# Patient Record
Sex: Male | Born: 1995 | Race: White | Hispanic: No | Marital: Single | Smoking: Never smoker
Health system: Southern US, Community
[De-identification: ages and names within clinical notes are randomized; demographics above are authoritative.]

## PROBLEM LIST (undated history)

## (undated) HISTORY — PX: ANKLE SURGERY: SHX546

---

## 2016-12-13 ENCOUNTER — Emergency Department
Admission: EM | Admit: 2016-12-13 | Discharge: 2016-12-13 | Disposition: A | Discharge status: Home / Self Care | Payer: No Typology Code available for payment source | Attending: Emergency Medicine | Admitting: Emergency Medicine

## 2016-12-13 ENCOUNTER — Emergency Department: Payer: No Typology Code available for payment source

## 2016-12-13 DIAGNOSIS — R002 Palpitations: Secondary | ICD-10-CM

## 2016-12-13 DIAGNOSIS — R0602 Shortness of breath: Secondary | ICD-10-CM | POA: Diagnosis present

## 2016-12-13 LAB — CBC
HEMATOCRIT: 49.6 % (ref 40.0–52.0)
HEMOGLOBIN: 17.3 g/dL (ref 13.0–18.0)
MCH: 29.9 pg (ref 26.0–34.0)
MCHC: 35 g/dL (ref 32.0–36.0)
MCV: 85.5 fL (ref 80.0–100.0)
Platelets: 241 10*3/uL (ref 150–440)
RBC: 5.8 MIL/uL (ref 4.40–5.90)
RDW: 12.9 % (ref 11.5–14.5)
WBC: 6.9 10*3/uL (ref 3.8–10.6)

## 2016-12-13 LAB — BASIC METABOLIC PANEL
Anion gap: 7 (ref 5–15)
BUN: 16 mg/dL (ref 6–20)
CHLORIDE: 105 mmol/L (ref 101–111)
CO2: 28 mmol/L (ref 22–32)
CREATININE: 1.02 mg/dL (ref 0.61–1.24)
Calcium: 9.8 mg/dL (ref 8.9–10.3)
GFR calc non Af Amer: >60 mL/min (ref >60)
Glucose, Bld: 93 mg/dL (ref 65–99)
POTASSIUM: 3.4 mmol/L — AB (ref 3.5–5.1)
Sodium: 140 mmol/L (ref 135–145)

## 2016-12-13 LAB — TROPONIN I: Troponin I: <0.03 ng/mL (ref <0.03)

## 2016-12-13 MED ORDER — ALBUTEROL SULFATE (2.5 MG/3ML) 0.083% IN NEBU
2.5000 mg | INHALATION_SOLUTION | Freq: Once | RESPIRATORY_TRACT | Status: AC
  Administered 2016-12-13: 2.5 mg via RESPIRATORY_TRACT

## 2016-12-13 MED ORDER — ALBUTEROL SULFATE (2.5 MG/3ML) 0.083% IN NEBU
INHALATION_SOLUTION | RESPIRATORY_TRACT | Status: AC
  Filled 2016-12-13: qty 3

## 2016-12-13 MED ORDER — IPRATROPIUM-ALBUTEROL 0.5-2.5 (3) MG/3ML IN SOLN: 3.0000 mL | Freq: Once | RESPIRATORY_TRACT | Status: DC

## 2016-12-13 NOTE — ED Provider Notes (Signed)
[facility] Emergency Department Provider Note   ____________________________________________    I have reviewed the triage vital signs and the nursing notes.   HISTORY  Chief Complaint Palpitations and shortness of breath  HPI Jon Nguyen is a 21 y.o. male who presents with complaints of palpitations. Patient reports last night and when he woke up this morning and felt like his heart was racing and he felt mildly short of breath as well. He denies chest pain. He's never had this before. He also felt tingling in his fingertips bilaterally. No pleurisy, no recent travel. No diaphoresis. Does report history of anxiety and is not sure if this is related. Currently feels well and has no complaints. He did receive a DuoNeb which he reports helped. Denies caffeine ingestion or energy drinks. No drugs.   No past medical history on file.  There are no active problems to display for this patient.   Past Surgical History:  Procedure Laterality Date  . ANKLE SURGERY      Prior to Admission medications   Not on File     Allergies Morphine and related  No family history on file.  Social History Social History  Substance Use Topics  . Smoking status: Never Smoker  . Smokeless tobacco: Not on file  . Alcohol use Yes    Review of Systems  Constitutional: No fever/chills Eyes: No visual changes.  ENT: No sore throat. Cardiovascular: as above Respiratory: Deniespleurisy Gastrointestinal: No abdominal pain.  No nausea, no vomiting.   Genitourinary: Negative for dysuria. Musculoskeletal: Negative for back pain. Skin: Negative for rash. Neurological: Negative for headaches    ____________________________________________   PHYSICAL EXAM:  VITAL SIGNS: ED Triage Vitals  Enc Vitals Group     BP 12/13/16 1315 121/73     Pulse Rate 12/13/16 1315 87     Resp 12/13/16 1315 15     Temp --      Temp src --      SpO2 12/13/16 1315 99 %   Weight --      Height --      Head Circumference --      Peak Flow --      Pain Score 12/13/16 1141 0     Pain Loc --      Pain Edu? --      Excl. in GC? --     Constitutional: Alert and oriented. No acute distress. Pleasant and interactive Eyes: Conjunctivae are normal.   Nose: No congestion/rhinnorhea.  Cardiovascular: Normal rate, regular rhythm. Grossly normal heart sounds.  Good peripheral circulation. Respiratory: Normal respiratory effort.  No retractions. Lungs CTAB. Gastrointestinal: Soft and nontender. No distention.  No CVA tenderness. Genitourinary: deferred Musculoskeletal: No lower extremity tenderness nor edema.  Warm and well perfused Neurologic:  Normal speech and language. No gross focal neurologic deficits are appreciated.  Skin:  Skin is warm, dry and intact. No rash noted. Psychiatric: Mood and affect are normal. Speech and behavior are normal.  ____________________________________________   LABS (all labs ordered are listed, but only abnormal results are displayed)  Labs Reviewed  BASIC METABOLIC PANEL - Abnormal; Notable for the following:       Result Value   Potassium 3.4 (*)    All other components within normal limits  CBC  TROPONIN I   ____________________________________________  EKG  ED ECG REPORT I, Jene Every, the attending physician, personally viewed and interpreted this ECG.  Date: 12/13/2016  Rhythm: normal sinus rhythm QRS  Axis: normal Intervals: normal ST/T Wave abnormalities: normal Narrative Interpretation: no evidence of acute ischemia  ____________________________________________  RADIOLOGY  Chest x-ray normal ____________________________________________   PROCEDURES  Procedure(s) performed: No    Critical Care performed: No ____________________________________________   INITIAL IMPRESSION / ASSESSMENT AND PLAN / ED COURSE  Pertinent labs & imaging results that were available during my care of the  patient were reviewed by me and considered in my medical decision making (see chart for details).   Differential diagnosis includes arrhythmia, anxiety,reactive airway disease.  Patient well-appearing and in no acute distress.lab work, EKG, chest x-ray unremarkable Patient on cardiac monitor for several hours in the emergency department with no abnormality is. Asymptomatic. Given unremarkable workup feel patient is provided for discharge and outpatient follow-up. Return precautions discussed       ____________________________________________   FINAL CLINICAL IMPRESSION(S) / ED DIAGNOSES  Final diagnoses:  Palpitations      NEW MEDICATIONS STARTED DURING THIS VISIT:  There are no discharge medications for this patient.    Note:  This document was prepared using Dragon voice recognition software and may include unintentional dictation errors.    Jene Every, MD 12/13/16 [phone]

## 2016-12-13 NOTE — ED Triage Notes (Signed)
Pt presents to ED with c/o that he feels like he can't breath. 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 States allergies as well. Rise and fall of chest noted. No extra work in breathing. Pt is tachypneic in triage. States he feels anxious.

## 2018-09-25 IMAGING — CR DG CHEST 2V
2 series · 2 of 2 positions shown · non-contrast
Comparison: None.

CLINICAL DATA: Difficulty breathing for 2 days, initial encounter

EXAM:
CHEST  2 VIEW

[chest pa]
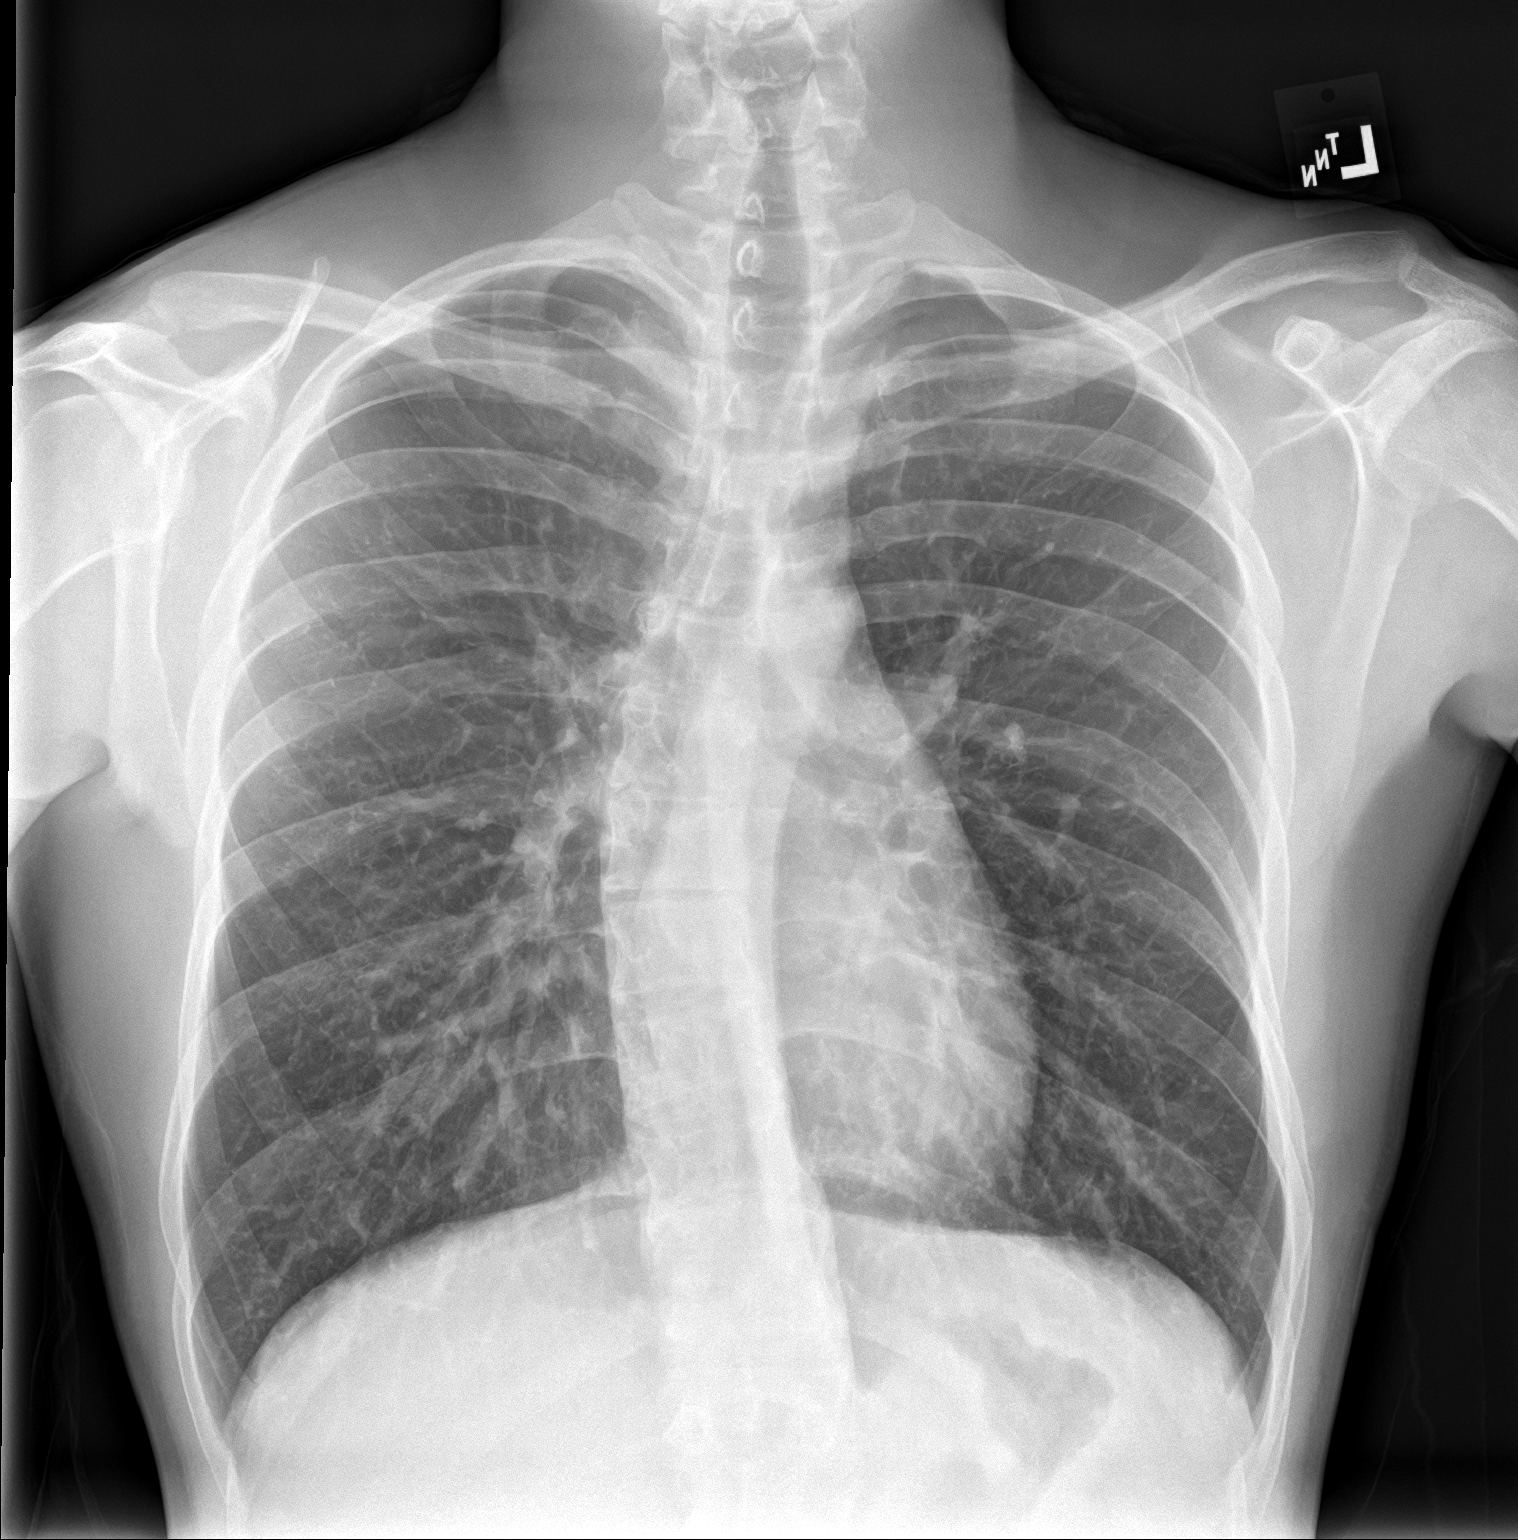

[chest lat]
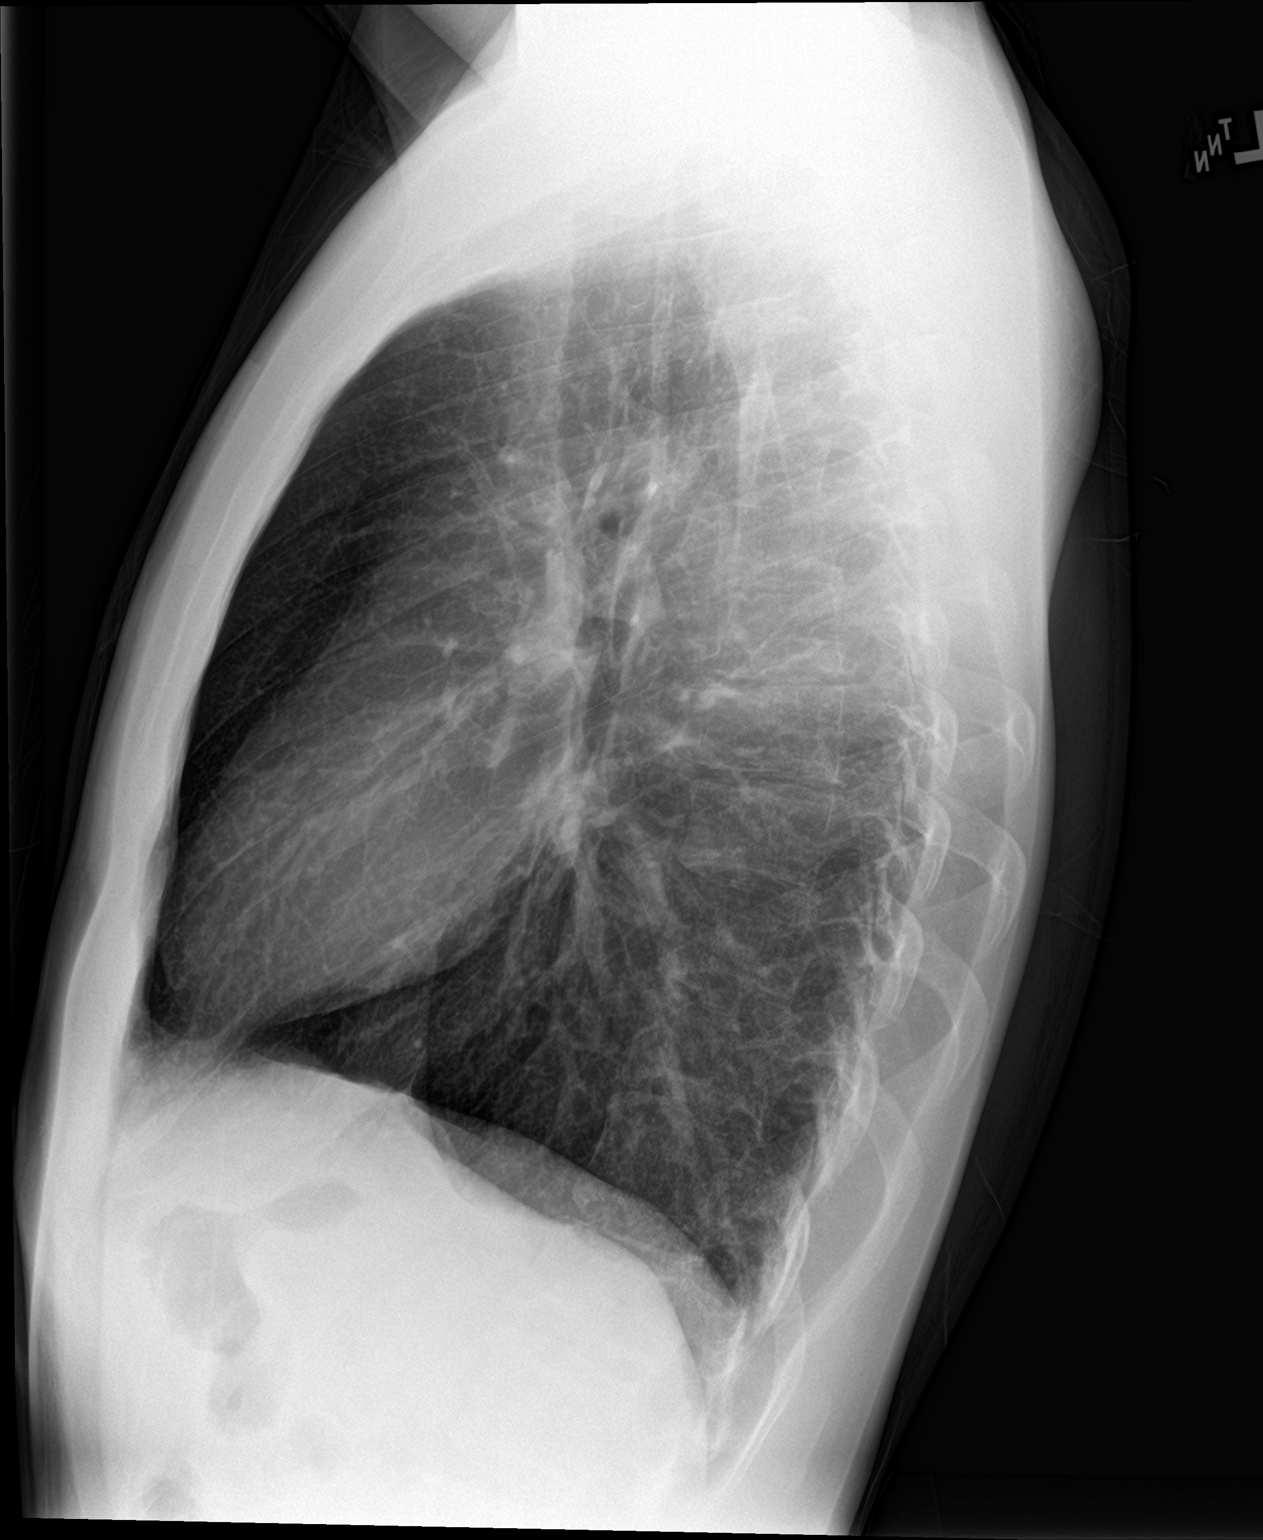

[2 of 2 positions shown; findings below may reference images not displayed]

FINDINGS: Cardiac shadow is within normal limits. The lungs are well aerated
bilaterally. No focal infiltrate or sizable effusion is seen. No
bony abnormality is noted aside from a mild scoliosis concave to the
left.
IMPRESSION: No acute abnormality noted.
